# Patient Record
Sex: Male | Born: 1990 | Race: Black or African American | Hispanic: No | Marital: Single | Smoking: Current every day smoker
Health system: Southern US, Community
[De-identification: ages and names within clinical notes are randomized; demographics above are authoritative.]

---

## 2013-03-24 ENCOUNTER — Encounter (HOSPITAL_COMMUNITY): Payer: Self-pay | Admitting: Emergency Medicine

## 2013-03-24 ENCOUNTER — Emergency Department (HOSPITAL_COMMUNITY)
Admission: EM | Admit: 2013-03-24 | Discharge: 2013-03-24 | Disposition: A | Discharge status: Home / Self Care | Payer: Self-pay | Attending: Emergency Medicine | Admitting: Emergency Medicine

## 2013-03-24 DIAGNOSIS — A630 Anogenital (venereal) warts: Secondary | ICD-10-CM

## 2013-03-24 NOTE — ED Provider Notes (Signed)
CSN: [id number]     Arrival date & time 03/24/13  1945 History  This chart was scribed for non-physician practitioner Antony Madura, PA-C working with Celene Kras, MD by Leone Payor, ED Scribe. This patient was seen in room WTR7/WTR7 and the patient's care was started at 1945.    Chief Complaint  Patient presents with  . Genital Warts    The history is provided by the patient. No language interpreter was used.    HPI Comments: Todd Mann is a 23 y.o. male who presents to the Emergency Department complaining of 3-4 weeks of constant, unchanged genital wart-like lesions. He denies any drainage from the affected site. Pt is sexually active and reports using protection. He denies fever, abdominal pain, emesis, scrotal swelling or redness, testicular pain, penile discharge, penile pain, numbness or paresthesia in lower extremities. .   History reviewed. No pertinent past medical history. History reviewed. No pertinent past surgical history. No family history on file. History  Substance Use Topics  . Smoking status: Never Smoker   . Smokeless tobacco: Not on file  . Alcohol Use: Not on file    Review of Systems  Constitutional: Negative for fever.  Gastrointestinal: Negative for abdominal pain.  Genitourinary: Positive for genital sores (lesion). Negative for dysuria, discharge, scrotal swelling, penile pain and testicular pain.  Neurological: Negative for numbness.  All other systems reviewed and are negative.    Allergies  Review of patient's allergies indicates no known allergies.  Home Medications  No current outpatient prescriptions on file. BP 143/82  Pulse 74  Temp(Src) 98.1 F (36.7 C) (Oral)  Resp 20  SpO2 100%  Physical Exam  Nursing note and vitals reviewed. Constitutional: He is oriented to person, place, and time. He appears well-developed and well-nourished. No distress.  HENT:  Head: Normocephalic and atraumatic.  Eyes: Conjunctivae and EOM are normal. No  scleral icterus.  Neck: Normal range of motion.  Pulmonary/Chest: Effort normal. No respiratory distress.  Abdominal: Hernia confirmed negative in the right inguinal area and confirmed negative in the left inguinal area.  Genitourinary: Testes normal and penis normal. Right testis shows no mass, no swelling and no tenderness. Right testis is descended. Left testis shows no mass, no swelling and no tenderness. Left testis is descended. Circumcised. No penile erythema or penile tenderness. No discharge found.  Wart-like lesion to the central suprapubic region on chaperoned GU exam with small warty lesions at base of penile shaft at 9 o'clock position.  Musculoskeletal: Normal range of motion.  Lymphadenopathy:       Right: No inguinal adenopathy present.       Left: No inguinal adenopathy present.  Neurological: He is alert and oriented to person, place, and time.  Skin: Skin is warm and dry. No rash noted. He is not diaphoretic. No erythema. No pallor.  Psychiatric: He has a normal mood and affect. His behavior is normal.    ED Course  Procedures (including critical care time)  DIAGNOSTIC STUDIES: Oxygen Saturation is 100% on RA, normal by my interpretation.    COORDINATION OF CARE: 9:38 PM Discussed treatment plan with pt at bedside and pt agreed to plan.   Labs Review Labs Reviewed - No data to display Imaging Review No results found.  EKG Interpretation   None       MDM   1. 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 Patient well and nontoxic appearing, hemodynamically stable, and afebrile. No penile swelling, redness, or tenderness.  No testicular tenderness, masses, or scrotal swelling appreciated. Patient stable and appropriate for discharge with dermatology referral for followup as well as resource guide. Return precautions discussed and patient agreeable to plan with no unaddressed concerns.  I personally performed the services described in this  documentation, which was scribed in my presence. The recorded information has been reviewed and is accurate.    Antony MaduraKelly Lysander Calixte, PA-C 03/24/13 2211

## 2013-03-24 NOTE — Discharge Instructions (Signed)
Recommend follow up with a dermatologist as the primary treatment for genital warts is cryotherapy or surgical removal rather than a medication or prescription. Refer to the resource guide below for additional resources in the area.  Genital Warts Genital warts are a sexually transmitted infection. They may appear as small bumps on the tissues of the genital area. CAUSES  Genital warts are caused by a virus called human papillomavirus (HPV). HPV is the most common sexually transmitted disease (STD) and infection of the sex organs. This infection is spread by having unprotected sex with an infected person. It can be spread by vaginal, anal, and oral sex. Many people do not know they are infected. They may be infected for years without problems. However, even if they do not have problems, they can unknowingly pass the infection to their sexual partners. SYMPTOMS   Itching and irritation in the genital area.  Warts that bleed.  Painful sexual intercourse. DIAGNOSIS  Warts are usually recognized with the naked eye on the vagina, vulva, perineum, anus, and rectum. Certain tests can also diagnose genital warts, such as:  A Pap test.  A tissue sample (biopsy) exam.  Colposcopy. A magnifying tool is used to examine the vagina and cervix. The HPV cells will change color when certain solutions are used. TREATMENT  Warts can be removed by:  Applying certain chemicals, such as cantharidin or podophyllin.  Liquid nitrogen freezing (cryotherapy).  Immunotherapy with candida or trichophyton injections.  Laser treatment.  Burning with an electrified probe (electrocautery).  Interferon injections.  Surgery. PREVENTION  HPV vaccination can help prevent HPV infections that cause genital warts and that cause cancer of the cervix. It is recommended that the vaccination be given to people between the ages 629 to 80102 years old. The vaccine might not work as well or might not work at all if you already  have HPV. It should not be given to pregnant women. HOME CARE INSTRUCTIONS   It is important to follow your caregiver's instructions. The warts will not go away without treatment. Repeat treatments are often needed to get rid of warts. Even after it appears that the warts are gone, the normal tissue underneath often remains infected.  Do not try to treat genital warts with medicine used to treat hand warts. This type of medicine is strong and can burn the skin in the genital area, causing more damage.  Tell your past and current sexual partner(s) that you have genital warts. They may be infected also and need treatment.  Avoid sexual contact while being treated.  Do not touch or scratch the warts. The infection may spread to other parts of your body.  Women with genital warts should have a cervical cancer check (Pap test) at least once a year. This type of cancer is slow-growing and can be cured if found early. Chances of developing cervical cancer are increased with HPV.  Inform your obstetrician about your warts in the event of pregnancy. This virus can be passed to the baby's respiratory tract. Discuss this with your caregiver.  Use a condom during sexual intercourse. Following treatment, the use of condoms will help prevent reinfection.  Ask your caregiver about using over-the-counter anti-itch creams. SEEK MEDICAL CARE IF:   Your treated skin becomes red, swollen, or painful.  You have a fever.  You feel generally ill.  You feel little lumps in and around your genital area.  You are bleeding or have painful sexual intercourse. MAKE SURE YOU:   Understand these  instructions.  Will watch your condition.  Will get help right away if you are not doing well or get worse. Document Released: 03/01/2000 Document Revised: 05/27/2011 Document Reviewed: 09/10/2010 [facility] Patient Information 2014 [place], [place].  Emergency Department Resource Guide 1) Find a Doctor and Pay Out  of Pocket Although you won't have to find out who is covered by your insurance plan, it is a good idea to ask around and get recommendations. You will then need to call the office and see if the doctor you have chosen will accept you as a new patient and what types of options they offer for patients who are self-pay. Some doctors offer discounts or will set up payment plans for their patients who do not have insurance, but you will need to ask so you aren't surprised when you get to your appointment.  2) Contact Your Local Health Department Not all health departments have doctors that can see patients for sick visits, but many do, so it is worth a call to see if yours does. If you don't know where your local health department is, you can check in your phone book. The CDC also has a tool to help you locate your state's health department, and many state websites also have listings of all of their local health departments.  3) Find a Walk-in Clinic If your illness is not likely to be very severe or complicated, you may want to try a walk in clinic. These are popping up all over the country in pharmacies, drugstores, and shopping centers. They're usually staffed by nurse practitioners or physician assistants that have been trained to treat common illnesses and complaints. They're usually fairly quick and inexpensive. However, if you have serious medical issues or chronic medical problems, these are probably not your best option.  No Primary Care Doctor: - Call Health Connect at  [phone] - they can help you locate a primary care doctor that  accepts your insurance, provides certain services, etc. - Physician Referral Service- [phone]  Chronic Pain Problems: Organization         Address  Phone   Notes  [facility]  [phone] Patients need to be referred by their primary care doctor.   Medication Assistance: Organization         Address  Phone   Notes  [facility]  Medication [facility] [address] [place], [place] [address] [phone] --Must be a resident of [facility] -- Must have NO insurance coverage whatsoever (no Medicaid/ Medicare, etc.) -- The pt. MUST have a primary care doctor that directs their care regularly and follows them in the community   [facility]  [phone]   [employer]  [phone]    Agencies that provide inexpensive medical care: Organization         Address  Phone   Notes  [facility]  [phone]   [facility]    [phone]   [facility] [address] [place], [place] [address] [phone]   [facility] [address] [place], [place] [phone]   Planned Parenthood    [phone]   [facility]    [phone]   [facility]  [address], [place] Phone:  [phone], Fax:  [phone] Hours of Operation:  9 am - 6 pm, M-F.  Also accepts Medicaid/Medicare and self-pay.  [facility]  [address], [place] Phone: [phone], Fax: [phone]. Hours of Operation:  8:30 am - 5:30 pm, M-F.  Also accepts Medicaid and self-pay.  [facility] [place] [address] [place] Phone: [phone]   [facility] [address] Natasha Bence [place], [place] [phone], Ext. [phone] Mondays & Thursdays: 7-9 AM.  First 15 patients are seen on a first come, first serve basis.    Medicaid-accepting [facility] Providers:  Organization         Address  Phone   Notes  [facility] [address], [place] [phone] Also accepts self-pay patients.  [facility] [address] Laurell Josephs [place], [place]  [phone]   [facility] [address], [place] [phone]   [facility] [address], [place] [phone]   Renaye Rakers [address], [place]   [phone] Only accepts [place] Access [place] patients after they have their name applied to their card.   Self-Pay (no insurance) in [facility]:  Organization         Address  Phone   Notes  Sickle Cell Patients, [facility] [address] [place], [place] [phone]   [facility] [address] [place], [place] [phone]   [facility]  [address], [place] [phone]   [facility]/Dr. Osei-Bonsu  [address] [place] or [address], [place] [phone] Phone number for both [place] and [place] locations is the same.  [facility] [address], [place] [phone]   [facility] [address], [place] or [address] [phone]   [facility] [address], [place] [phone], phone; [phone], fax Sees patients 1st and 3rd Saturday of every month.  Must not qualify for public or private insurance (i.e. Medicaid, Medicare, [place] Health Choice, Veterans' Benefits)  Household income should be no more than 200% of the poverty level The clinic cannot treat you if you are pregnant or think you are pregnant  Sexually transmitted diseases are not treated at the clinic.    Dental Care: Organization         Address  Phone  Notes  [facility] Department of [facility] [address] [employer], [place] [phone] Accepts children up to age 74 who are enrolled in [place] or [place] Health Choice; pregnant women with a Medicaid card; and children who have applied for Medicaid or [place] Health Choice, but were declined, whose parents can pay a reduced fee at time of service.  [facility] Department of [facility]  [address] [place] [address], [place]  [phone] Accepts children up to age 39 who are enrolled in [place] or [place] Health Choice; pregnant women with a Medicaid card; and children who have applied for Medicaid or [place] Health Choice, but were declined, whose parents can pay a reduced fee at time of service.  [facility]  [address] [place], [place] [phone] Patients are seen by appointment only. Walk-ins are not accepted. [facility] will see patients 62 years of age and older. Monday - Tuesday (8am-5pm) Most Wednesdays (8:30-5pm) $30 per visit, cash  only  [facility] Adult Dental Access PRO[facility]  [address], [facility] [phone] Patients are seen by appointment only. Walk-ins are not accepted. [facility] will see patients 35 years of age and older. One Wednesday Evening (Monthly: Volunteer Based).  $30 per visit, cash only  [employer]  [phone] for adults; Children under age 109, call Graduate Pediatric Dentistry at [phone]. Children aged 67-14, please call [phone] to request a pediatric application.  Dental services are provided in all areas of dental care including fillings, crowns and bridges, complete and partial dentures, implants, gum treatment, root canals, and extractions. Preventive care is also provided. Treatment is provided to both adults and children. Patients are selected via a lottery and there is often a waiting list.   [facility] [address], [place]  [phone] [email or web address]   [facility] [address] [place], [place] [phone], Ext. [phone] Second and Fourth Thursday of each month, opens at 6:30 AM; Clinic ends at 9 AM.  Patients are seen on a first-come first-served basis, and a limited number are seen during each clinic.   [facility]  [address] [place], [place] [phone]   Eligibility Requirements You must have lived in [place], [place], or [place]  counties for at least the last three months.   You cannot be eligible for state or federal sponsored [place], including [employer], [place], or [employer].   You generally cannot be eligible for healthcare insurance through your employer.    How to apply: Eligibility screenings are held every Tuesday and Wednesday afternoon from 1:00 pm until 4:00 pm. You do not need an appointment for the interview!  [facility] [address] [place], [place] [phone]   [facility] Health Department  [phone]   [facility] Health Department  [phone]   [facility] Health Department  [phone]    Behavioral Health Resources in the Community: Intensive Outpatient Programs Organization         Address  Phone  Notes  [facility] [address], [place], [place] [phone]   [facility] Outpatient [address], [place], [place] [phone]   [facility] [address] [place], [place]  [phone]   [facility] [address]  [place], [place] [phone] or [phone]   Substance Abuse Resources Organization         Address  Phone  Notes  [facility]  7167996135   [facility]  [phone]   [facility] [place]  [phone]   Floydene Flock  [phone]   Residential & Outpatient Substance Abuse Program  ([phone]   Psychological Services Organization         Address  Phone  Notes  [facility]  [phone]   [facility] Services  [phone]   [facility] [address] [place] [phone] or [phone]    Mobile Crisis Teams Organization         Address  Phone  Notes  [facility], Mobile Crisis Care Unit  [phone]   [facility]  [address] [place], [place] [phone]   Doristine Locks [address] [place]  [place] [phone]    Self-Help/Support Groups Organization         Address  Phone  Notes  [facility] [place] - variety of support groups  336- I7437963 Call for more information  Narcotics Anonymous (NA), [facility] [address], [employer] [place]  2 meetings at this location   Statistician         Address  Phone  Notes  [facility] [address],    [place]  [phone]   [facility]  [address] [place] [address], [place], [place] [phone]   [facility] [address] [place], [place] [phone] Admissions: 8am-3pm M-F  [facility] [address]    [place], [place] [phone]   [facility] [address] [place], [place], [place] [phone]   [facility] [address]  [place], [place] [phone]   [facility] - Intensive Outpatient [address] Laurell Josephs [address], [place], [place] [phone]   [facility] [address] [place]  [place], [place] [phone] or [phone]   [facility] ([facility]) [address] [place], [place] [phone] Accepts Medicaid  [facility] [address],  [place] [phone] Substance Abuse/Addiction Treatment   [facility] Organization         Address  Phone  Notes  [facility]  (820) [phone]   Angie Fava, PhD [address] Ervin Knack [place], [place]   [phone]) [phone] or [phone]   [facility] Behavioral   [address] [place], [place] [phone]   [facility] [address], [place], [place] [phone] Insurance/Medicaid/sponsorship through [facility] and Families [address]                                    [place], [place] [phone] Therapy/tele-psych/case  [facility] [address], [place] [phone]    Dr. Lolly Mustache  [phone]   [facility]  [employer] [facility] Dept. 1) [address],  2) [address], [place] 3)  [address], [place] [phone]  [phone]   [facility] Child Abuse Hotline 575-805-8158 or ([phone] (After Hours)

## 2013-03-24 NOTE — ED Notes (Signed)
Pt shaved pubic area a few wks ago; bump came up--wont go away; no drainage; hard and painful

## 2013-03-30 NOTE — ED Provider Notes (Signed)
Medical screening examination/treatment/procedure(s) were performed by non-physician practitioner and as supervising physician I was immediately available for consultation/collaboration.    Celene KrasJon R Amori Colomb, MD 03/30/13 [phone]

## 2015-01-23 ENCOUNTER — Emergency Department (HOSPITAL_COMMUNITY)
Admission: EM | Admit: 2015-01-23 | Discharge: 2015-01-23 | Disposition: A | Discharge status: Home / Self Care | Payer: Self-pay | Attending: Emergency Medicine | Admitting: Emergency Medicine

## 2015-01-23 ENCOUNTER — Encounter (HOSPITAL_COMMUNITY): Payer: Self-pay | Admitting: *Deleted

## 2015-01-23 DIAGNOSIS — Z720 Tobacco use: Secondary | ICD-10-CM | POA: Insufficient documentation

## 2015-01-23 DIAGNOSIS — N342 Other urethritis: Secondary | ICD-10-CM | POA: Insufficient documentation

## 2015-01-23 MED ORDER — LIDOCAINE HCL (PF) 1 % IJ SOLN
5.0000 mL | Freq: Once | INTRAMUSCULAR | Status: AC
  Administered 2015-01-23: 5 mL
  Filled 2015-01-23: qty 5

## 2015-01-23 MED ORDER — AZITHROMYCIN 250 MG PO TABS
1000.0000 mg | ORAL_TABLET | Freq: Once | ORAL | Status: AC
  Administered 2015-01-23: 1000 mg via ORAL
  Filled 2015-01-23: qty 4

## 2015-01-23 MED ORDER — CEFTRIAXONE SODIUM 250 MG IJ SOLR
250.0000 mg | Freq: Once | INTRAMUSCULAR | Status: AC
Start: 2015-01-23 — End: 2015-01-23
  Administered 2015-01-23: 250 mg via INTRAMUSCULAR
  Filled 2015-01-23: qty 250

## 2015-01-23 NOTE — Discharge Instructions (Signed)
You were not tested for all STDs today. See the Department of Health STD Clinic (Address: [address] Phone: [phone]) for full STD screening. Return to the emergency room for worsening of symptoms, fever, and vomiting.  Do not hesitate to return to the emergency room for any new, worsening or concerning symptoms.  Please obtain primary care using resource guide below. Let them know that you were seen in the emergency room and that they will need to obtain records for further outpatient management.    Emergency Department Resource Guide 1) Find a Doctor and Pay Out of Pocket Although you won't have to find out who is covered by your insurance plan, it is a good idea to ask around and get recommendations. You will then need to call the office and see if the doctor you have chosen will accept you as a new patient and what types of options they offer for patients who are self-pay. Some doctors offer discounts or will set up payment plans for their patients who do not have insurance, but you will need to ask so you aren't surprised when you get to your appointment.  2) Contact Your Local Health Department Not all health departments have doctors that can see patients for sick visits, but many do, so it is worth a call to see if yours does. If you don't know where your local health department is, you can check in your phone book. The CDC also has a tool to help you locate your state's health department, and many state websites also have listings of all of their local health departments.  3) Find a Walk-in Clinic If your illness is not likely to be very severe or complicated, you may want to try a walk in clinic. These are popping up all over the country in pharmacies, drugstores, and shopping centers. They're usually staffed by nurse practitioners or physician assistants that have been trained to treat common illnesses and complaints. They're usually fairly quick and inexpensive. However, if you  have serious medical issues or chronic medical problems, these are probably not your best option.  No Primary Care Doctor: - Call Health Connect at  [phone] - they can help you locate a primary care doctor that  accepts your insurance, provides certain services, etc. - Physician Referral Service- [phone]  Chronic Pain Problems: Organization         Address  Phone   Notes  [facility]  [phone] Patients need to be referred by their primary care doctor.   Medication Assistance: Organization         Address  Phone   Notes  [facility] [address] [place] [place], [place] [address] [phone] --Must be a resident of [facility] -- Must have NO insurance coverage whatsoever (no Medicaid/ Medicare, etc.) -- The pt. MUST have a primary care doctor that directs their care regularly and follows them in the community   MedAssist  [phone]   [employer]  [phone]    Agencies that provide inexpensive medical care: Organization         Address  Phone   Notes  [facility]  [phone]   [facility]    [phone]   [facility] [address] [place], [place] [address] [phone]   [facility] [address] [place] [place] [phone]   Planned Parenthood    [phone])  [phone]   [facility]    [phone]) [phone]   [facility]  [address], [place] Phone:  [phone](336) [phone], Fax:  [phone](336) [phone] Hours of Operation:  9 am - 6 pm, M-F.  Also accepts Medicaid/Medicare and self-pay.  [facility]  [address], [place] Phone: [phone]) [phone], Fax: [phone]. Hours of Operation:  8:30 am - 5:30 pm, M-F.  Also accepts Medicaid and self-pay.  [facility] [place] [address], [place] Phone: [phone](336) [phone]   [facility] [address], [place], [place] [phone], Ext. [phone] Mondays & Thursdays: 7-9 AM.  First 15 patients are seen on a first come, first serve basis.    Medicaid-accepting [facility] Providers:  Organization         Address  Phone   Notes  [facility] [address], [place] [phone]) [phone] Also accepts self-pay patients.  [facility] [address] [place] [address], [place]  [phone](336) [phone]   [facility] [address], [place] [phone]) [phone]   [facility] [address] [place] [address], [place] [phone]) [phone]   Renaye RakersVeita Bland [address], [place]   [phone]) [phone] Only accepts [place] Access [place] patients after they have their name applied to their card.   Self-Pay (no insurance) in [facility]:  Organization         Address  Phone   Notes  Sickle Cell Patients, [facility] [address], [place] [phone]) [phone]   [facility] [address], [place] [phone]) [phone]   [facility]  [address], [place] [phone]   [facility]/Dr. Osei-Bonsu  [address] [place] [address], [place] or [phone], [place] [phone]) [phone] Phone number for both [place] and [place] locations is the same.  [facility] [address], [place] [phone]) [phone]   [facility] [place] [address] [place] [address], [place] or [address] [phone]   [facility] [address], [place] [phone]) [phone], phone; [phone]) [phone], fax Sees patients 1st and 3rd Saturday of every month.  Must not qualify for public or private insurance (i.e. Medicaid, Medicare, [place] Health Choice, Veterans' Benefits)  Household income should be no more than 200% of the poverty level The clinic cannot treat you if you are pregnant or think you are pregnant   Sexually transmitted diseases are not treated at the clinic.    Dental Care: Organization         Address  Phone  Notes  [facility] of [facility] [address], [place] [phone]) [phone] Accepts children up to age 25 who are enrolled in [place] or [place] Health Choice; pregnant women with a Medicaid card; and children who have applied for Medicaid or [place] Health Choice, but were declined, whose parents can pay a reduced fee at time of service.  [facility] Department of [facility] [place]  [address], [place] [phone] Accepts children up to age 25 who are enrolled in [place] or [place] Health Choice; pregnant women with a Medicaid card; and children who have applied for Medicaid or [place] Health Choice, but were declined, whose parents can pay a reduced  fee at time of service.  [facility]  [place] [phone] Patients are seen by appointment only. Walk-ins are not accepted. [place] will see patients 48 years of age and older. Monday - Tuesday (8am-5pm) Most Wednesdays (8:30-5pm) $30 per visit, cash only  [facility] Adult Dental Access PROGRAM  [address], [facility] [phone] Patients are seen by appointment only. Walk-ins are not accepted. [place] will see patients 49 years of age and older. One Wednesday Evening (Monthly: Volunteer Based).  $30 per visit, cash only  [place]  [phone] for adults; Children under age 70, call Graduate Pediatric Dentistry at [phone]. Children aged 72-14, please call [phone] to request a pediatric application.  Dental services are provided in all areas of dental care including fillings, crowns and bridges, complete and partial dentures, implants, gum treatment, root canals, and extractions. Preventive care is also provided. Treatment is provided to both adults and children. Patients  are selected via a lottery and there is often a waiting list.   [facility] [address], [place]  [phone] [email or web address]   [facility] [address] [place], [place] [phone], Ext. [phone] Second and Fourth Thursday of each month, opens at 6:30 AM; Clinic ends at 9 AM.  Patients are seen on a first-come first-served basis, and a limited number are seen during each clinic.   [facility]  [address] [place], [place] [phone]   Eligibility Requirements You must have lived in [place], [place], or [place] counties for at least the last three months.   You cannot be eligible for state or federal sponsored [employer], including [employer], [place], or [employer].   You generally cannot be eligible for healthcare insurance through your employer.    How to apply: Eligibility screenings are held every Tuesday and Wednesday afternoon from 1:00 pm until 4:00 pm. You do not need an appointment for the interview!  [facility] [address], [place], [place]   [place]  [place] Department  [place]  [phone]    Behavioral Health Resources in the Community: Intensive Outpatient Programs Organization         Address  Phone  Notes  [place]. [address] [place], [place] [phone]   [facility] [address], [place], [place]   ADS: Alcohol & Drug Svcs [address] [place], [place]   [place] [address]  [place], [place] or [phone]   Substance Abuse Resources Organization         Address  Phone  Notes  Alcohol and Drug Services  [phone]   [place]  [phone]   The [place]   Chinita Pester  [phone]    Residential & Outpatient Substance Abuse Program  [phone]   Psychological Services Organization         Address  Phone  Notes  [facility]  [place]  [phone]   [place] [address], [place] or [phone]    Mobile Crisis Teams Organization         Address  Phone  Notes  [facility], Mobile Crisis Care Unit  [phone]  [facility]  [address] [place], [place]   [facility] [address] [place] [phone]    Self-Help/Support Groups Organization         Address  Phone             Notes  [facility]ental [facility] Assoc[facility] of [place] - variety of support groups  [place] Call for more information  Narcotics Anonymous (NA), [facility] [address], [employer] [place]  2 meetings at this location   Special educational needs teacher         Address  Phone  Notes  [facility] Residential Treatment [place],    [place]  [phone]   [facility]  [address], [place] [phone], [place], [place]   [place], [place] [phone] Admissions: 8am-3pm M-F  Incentives Substance [place] [address]    [place], [place] [phone]   [facility] [address] [place], [place], [place]   [facility] [address]  [place] [address], [place]   [facility] Programs - Intensive Outpatient [address], [place], [place]   [facility] ([place].) [place].,  [place], [place] [phone] or [phone]   [facility] (RTS) [address], [place], [place] Accepts Medicaid  [facility] [address],  [place] [phone] Substance Abuse/Addiction Treatment   [facility] Organization         Address  Phone  Notes  [facility]  [phone]   Domenic Schwab, PhD [address]lis [address] [place], [place]   [phone] or [phone]   Painter [place], [place] [phone]   [facility] [address] [place], [place] [phone] Insurance/Medicaid/sponsorship through [facility] and Families [address], [address] [place]                                    [place], [place] [phone] [place] [address] [place] [address], [place] [phone]    Dr. Adele Schilder  [phone]   [facility] Dept. 1) [address] [place] 2) [place] 3)  [place] [address], [place] [phone]  [phone]   [place] [phone] or [phone] (After Hours)

## 2015-01-23 NOTE — ED Provider Notes (Signed)
CSN: [id number]     Arrival date & time 01/23/15  1305 History  By signing my name below, I, Todd Mann, attest that this documentation has been prepared under the direction and in the presence of [employer], PA-C. Electronically Signed: Ronney Mann, ED Scribe. 01/23/2015. 2:05 PM.    Chief Complaint  Patient presents with  . SEXUALLY TRANSMITTED DISEASE   The history is provided by the patient. No language interpreter was used.    HPI Comments: Todd Mann is a 24 y.o. male who presents to the Emergency Department with no symptoms but seeking treatment for chlamydia. Patient states he had gone to the health department last week for full-spectrum STD testing. He states he did not have any symptoms but had gone for periodic testing. The health department had called him today, informed him he had chlamydia, and advised him to seek treatment. He states he was told he was negative for any other STD. He states he tried to go to the health department, but they were booked, so he came to the ED. He denies any urinary symptoms, testicular or penile pain, scrotal or penile swelling, or rashes.   History reviewed. No pertinent past medical history. History reviewed. No pertinent past surgical history. No family history on file. Social History  Substance Use Topics  . Smoking status: Current Every Day Smoker  . Smokeless tobacco: None  . Alcohol Use: No    Review of Systems A complete 10 system review of systems was obtained and all systems are negative except as noted in the HPI and PMH.    Allergies  Review of patient's allergies indicates no known allergies.  Home Medications   Prior to Admission medications   Not on File   BP 117/72 mmHg  Pulse 87  Temp(Src) 97.9 F (36.6 C) (Oral)  Resp 18  SpO2 99% Physical Exam  Constitutional: He is oriented to person, place, and time. He appears well-developed and well-nourished. No distress.  HENT:  Head: Normocephalic and atraumatic.   Eyes: Conjunctivae and EOM are normal.  Neck: Neck supple. No tracheal deviation present.  Cardiovascular: Normal rate.   Pulmonary/Chest: Effort normal. No respiratory distress.  Musculoskeletal: Normal range of motion.  Neurological: He is alert and oriented to person, place, and time.  Skin: Skin is warm and dry.  Psychiatric: He has a normal mood and affect. His behavior is normal.  Nursing note and vitals reviewed.   ED Course  Procedures (including critical care time)  DIAGNOSTIC STUDIES: Oxygen Saturation is 99% on RA, normal by my interpretation.    COORDINATION OF CARE: 2:00 PM - Discussed plan with pt at bedside which includes STD testing. Will also Rx medications and advised pt to abstain from sexual activity for 7-10 days. Also encouraged safe sex practices. Pt verbalized understanding and agreed to plan.   Labs Review Labs Reviewed - No data to display   MDM   Final diagnoses:  Urethritis    Filed Vitals:   01/23/15 1330  BP: 117/72  Pulse: 87  Temp: 97.9 F (36.6 C)  TempSrc: Oral  Resp: 18  SpO2: 99%    Medications  cefTRIAXone (ROCEPHIN) injection 250 mg (not administered)  azithromycin (ZITHROMAX) tablet 1,000 mg (not administered)    Todd Mann is 24 y.o. male presenting with request for treatment for chlamydia. Patient states he could not return to the health department with a test for originally conducted. Since I cannot see the results of this patient's testing I'm going  to treat him for gonorrhea and chlamydia. We've had a extensive discussion on safe sex and he understands he's going to have to refrain from sex while the antibiotics eradicate the infection.  Evaluation does not show pathology that would require ongoing emergent intervention or inpatient treatment. Pt is hemodynamically stable and mentating appropriately. Discussed findings and plan with patient/guardian, who agrees with care plan. All questions answered. Return precautions  discussed and outpatient follow up given.   I personally performed the services described in this documentation, which was scribed in my presence. The recorded information has been reviewed and is accurate.     Wynetta Emeryicole Mystic Labo, PA-C 01/23/15 1407  Benjiman CoreNathan Pickering, MD 01/26/15 2248

## 2015-01-23 NOTE — ED Notes (Signed)
Declined W/C at D/C and was escorted to lobby by RN. 

## 2015-01-23 NOTE — ED Notes (Signed)
Health dept called and told him he had chlamydia and here to get treatment.

## 2016-01-12 ENCOUNTER — Emergency Department (HOSPITAL_COMMUNITY)
Admission: EM | Admit: 2016-01-12 | Discharge: 2016-01-12 | Disposition: A | Discharge status: Home / Self Care | Payer: Self-pay | Attending: Emergency Medicine | Admitting: Emergency Medicine

## 2016-01-12 ENCOUNTER — Encounter (HOSPITAL_COMMUNITY): Payer: Self-pay | Admitting: Emergency Medicine

## 2016-01-12 DIAGNOSIS — Z202 Contact with and (suspected) exposure to infections with a predominantly sexual mode of transmission: Secondary | ICD-10-CM | POA: Insufficient documentation

## 2016-01-12 DIAGNOSIS — F17200 Nicotine dependence, unspecified, uncomplicated: Secondary | ICD-10-CM | POA: Insufficient documentation

## 2016-01-12 LAB — RAPID HIV SCREEN (HIV 1/2 AB+AG)
HIV 1/2 ANTIBODIES: NONREACTIVE
HIV-1 P24 ANTIGEN - HIV24: NONREACTIVE

## 2016-01-12 MED ORDER — CEFTRIAXONE SODIUM 250 MG IJ SOLR
250.0000 mg | Freq: Once | INTRAMUSCULAR | Status: AC
  Administered 2016-01-12: 250 mg via INTRAMUSCULAR
  Filled 2016-01-12: qty 250

## 2016-01-12 MED ORDER — STERILE WATER FOR INJECTION IJ SOLN
INTRAMUSCULAR | Status: AC
  Administered 2016-01-12: 0.9 mL
  Filled 2016-01-12: qty 10

## 2016-01-12 MED ORDER — AZITHROMYCIN 250 MG PO TABS
1000.0000 mg | ORAL_TABLET | Freq: Once | ORAL | Status: AC
  Administered 2016-01-12: 1000 mg via ORAL
  Filled 2016-01-12: qty 4

## 2016-01-12 NOTE — ED Provider Notes (Signed)
[facility]-EMERGENCY DEPT Provider Note   CSN: [id number] Arrival date & time: 01/12/16  1958  By signing my name below, I, Todd Mann, attest that this documentation has been prepared under the direction and in the presence of  Fayrene Helper, PA-C. Electronically Signed: [place] Mann, ED Scribe. 01/12/16. 8:26 PM.   History   Chief Complaint Chief Complaint  Patient presents with  . Exposure to STD   The history is provided by the patient. No language interpreter was used.   HPI Comments:  Todd Mann is a 25 y.o. male who presents to the Emergency Department needing treatment for STD. Pt states he had unprotected sexual intercourse. He states he was tested for STDs this week which showed he was positive for chlamydia and gonorrhea. Pt denies any penile discharge, dysuria, or penile pain. No alleviating factors noted. Pt denies any other complaints at this time.   History reviewed. No pertinent past medical history.  There are no active problems to display for this patient.   History reviewed. No pertinent surgical history.  Home Medications    Prior to Admission medications   Not on File    Family History No family history on file.  Social History Social History  Substance Use Topics  . Smoking status: Current Every Day Smoker  . Smokeless tobacco: Former Neurosurgeon  . Alcohol use No     Allergies   Review of patient's allergies indicates no known allergies.   Review of Systems Review of Systems  Genitourinary: Negative for discharge, dysuria and penile pain.     Physical Exam Updated Vital Signs BP 115/83 (BP Location: Right Arm)   Pulse 89   Temp 98.4 F (36.9 C) (Oral)   Resp 18   Ht 5\' 9"  (1.753 m)   Wt 174 lb (78.9 kg)   SpO2 100%   BMI 25.70 kg/m   Physical Exam  Constitutional: He is oriented to person, place, and time. He appears well-developed and well-nourished. No distress.  HENT:  Head: Normocephalic and atraumatic.  Eyes: Conjunctivae are  normal.  Cardiovascular: Normal rate.   Pulmonary/Chest: Effort normal.  Abdominal: He exhibits no distension.  Genitourinary:  Genitourinary Comments: Chaperone present during exam.  No inguinal lymphadenopathy or inguinal hernia noted.  Normal circumcised penis free of lesion or rash.  Testicle with normal lie and nontender.  Normal scrotum, perineum soft.    Neurological: He is alert and oriented to person, place, and time.  Skin: Skin is warm and dry.  Psychiatric: He has a normal mood and affect.  Nursing note and vitals reviewed.    ED Treatments / Results  DIAGNOSTIC STUDIES:  Oxygen Saturation is 100% on RA, normal by my interpretation.    COORDINATION OF CARE:  8:27 PM Discussed treatment plan with pt at bedside and pt agreed to plan.  Labs (all labs ordered are listed, but only abnormal results are displayed) Labs Reviewed  RAPID HIV SCREEN (HIV 1/2 AB+AG)  RPR  GC/CHLAMYDIA PROBE AMP ([place]) NOT AT [facility]    EKG  EKG Interpretation None       Radiology No results found.  Procedures Procedures (including critical care time)  Medications Ordered in ED Medications - No data to display   Initial Impression / Assessment and Plan / ED Course  I have reviewed the triage vital signs and the nursing notes.  Pertinent labs & imaging results that were available during my care of the patient were reviewed by me and considered in my medical  decision making (see chart for details).  Clinical Course    Patient treated in the ED for STI with rocephin/zithromax. Patient advised to inform and treat all sexual partners.  Pt advised on safe sex practices and understands that they have GC/Chlamydia cultures pending and will result in 2-3 days. HIV and RPR sent. Pt encouraged to follow up at local health department for future STI checks. No concern for prostatitis or epididymitis. Discussed return precautions. Pt appears safe for discharge.      Final Clinical  Impressions(s) / ED Diagnoses   Final diagnoses:  STD exposure    New Prescriptions New Prescriptions   No medications on file  I personally performed the services described in this documentation, which was scribed in my presence. The recorded information has been reviewed and is accurate.       Fayrene HelperBowie Sylvestre Rathgeber, PA-C 01/12/16 2124    Doug SouSam Jacubowitz, MD 01/12/16 [phone]

## 2016-01-12 NOTE — ED Triage Notes (Signed)
Pt states his partner was tested for STDs this week and tested positive for chlamydia and gonorrhea. Pt states he would like to be tested and treated for STDs as well. Pt denies any penile discharge and denies pain with urination.

## 2016-01-13 LAB — RPR: RPR Ser Ql: NONREACTIVE

## 2016-01-15 LAB — GC/CHLAMYDIA PROBE AMP (~~LOC~~) NOT AT ARMC
CHLAMYDIA, DNA PROBE: POSITIVE — AB
Neisseria Gonorrhea: NEGATIVE

## 2016-01-16 ENCOUNTER — Telehealth (HOSPITAL_BASED_OUTPATIENT_CLINIC_OR_DEPARTMENT_OTHER): Payer: Self-pay | Admitting: Emergency Medicine

## 2016-01-30 ENCOUNTER — Telehealth (HOSPITAL_BASED_OUTPATIENT_CLINIC_OR_DEPARTMENT_OTHER): Payer: Self-pay | Admitting: Emergency Medicine

## 2016-01-30 NOTE — Telephone Encounter (Signed)
No response to letter LOST TO FOLLOWUP 

## 2019-02-23 ENCOUNTER — Other Ambulatory Visit: Payer: Self-pay

## 2019-02-23 ENCOUNTER — Ambulatory Visit (INDEPENDENT_AMBULATORY_CARE_PROVIDER_SITE_OTHER): Payer: Self-pay

## 2019-02-23 ENCOUNTER — Other Ambulatory Visit: Payer: Self-pay | Admitting: Internal Medicine

## 2019-02-23 DIAGNOSIS — Z021 Encounter for pre-employment examination: Secondary | ICD-10-CM

## 2020-06-11 ENCOUNTER — Emergency Department (HOSPITAL_COMMUNITY)
Admission: EM | Admit: 2020-06-11 | Discharge: 2020-06-11 | Disposition: A | Discharge status: Home / Self Care | Payer: BC Managed Care – PPO | Attending: Emergency Medicine | Admitting: Emergency Medicine

## 2020-06-11 ENCOUNTER — Encounter (HOSPITAL_COMMUNITY): Payer: Self-pay | Admitting: Emergency Medicine

## 2020-06-11 DIAGNOSIS — Z113 Encounter for screening for infections with a predominantly sexual mode of transmission: Secondary | ICD-10-CM | POA: Insufficient documentation

## 2020-06-11 DIAGNOSIS — Z5321 Procedure and treatment not carried out due to patient leaving prior to being seen by health care provider: Secondary | ICD-10-CM | POA: Diagnosis not present

## 2020-06-11 NOTE — ED Notes (Signed)
Called for room x 2.  

## 2020-06-11 NOTE — ED Notes (Signed)
Called for room x6

## 2020-06-11 NOTE — ED Triage Notes (Signed)
Partner tested + for chlamydia and gonorrhea.  Pt requesting STD testing.  Denies any symptoms.

## 2020-10-22 IMAGING — DX DG CHEST 1V
1 series · 1 of 1 positions shown · non-contrast
Comparison: None.

CLINICAL DATA: Cream pleura mint examination.

EXAM:
CHEST  1 VIEW

[chest pa]
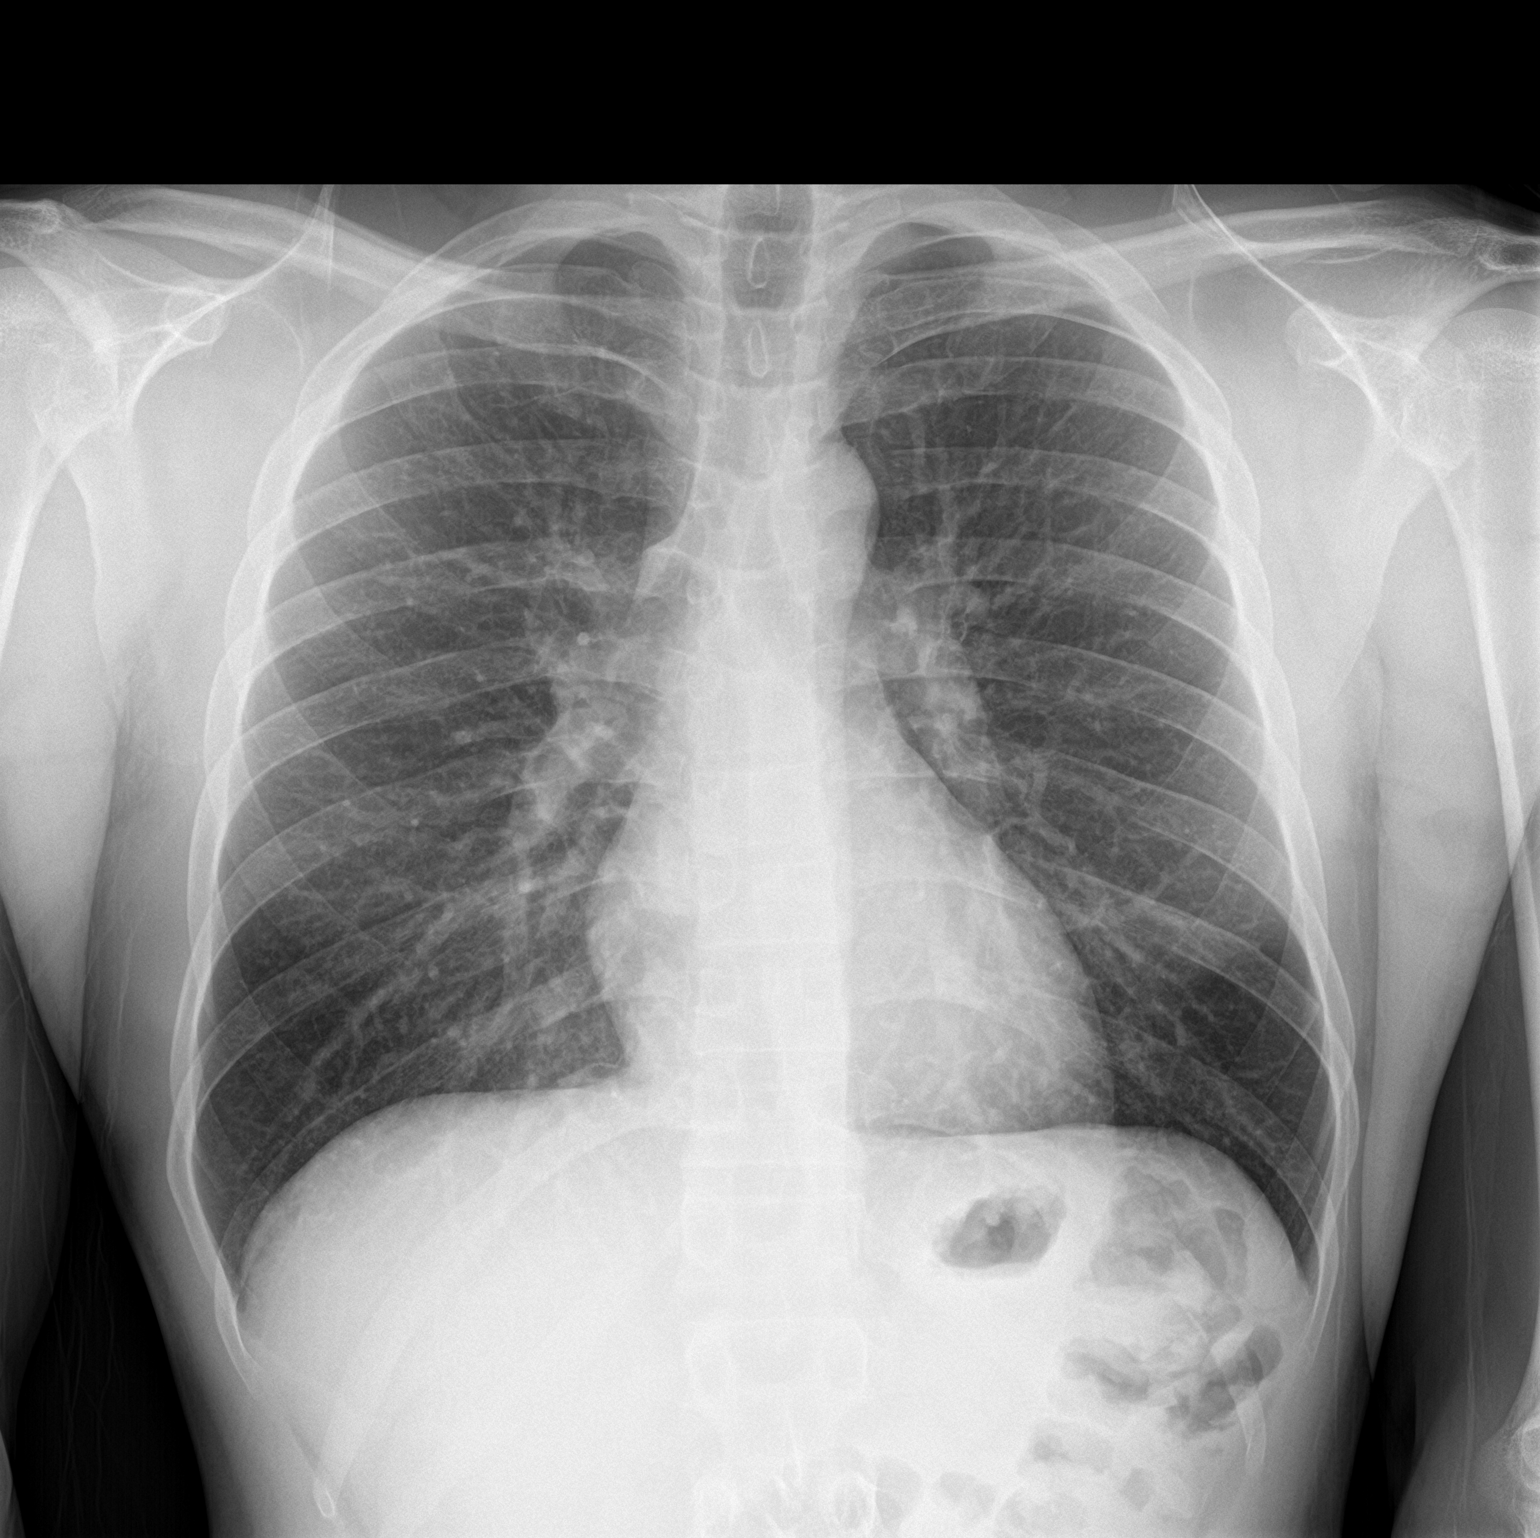

[1 of 1 positions shown; findings below may reference images not displayed]

FINDINGS: The heart size and mediastinal contours are within normal limits.
Both lungs are clear. The visualized skeletal structures are
unremarkable.
IMPRESSION: No active disease.
